# Patient Record
Sex: Male | Born: 1935 | Race: White | Hispanic: No
Health system: Southern US, Community
[De-identification: ages and names within clinical notes are randomized; demographics above are authoritative.]

---

## 2004-03-24 ENCOUNTER — Encounter: Payer: Self-pay | Admitting: Internal Medicine

## 2004-04-15 ENCOUNTER — Encounter: Payer: Self-pay | Admitting: Internal Medicine

## 2007-11-28 ENCOUNTER — Ambulatory Visit: Payer: Self-pay | Admitting: Internal Medicine

## 2008-01-14 ENCOUNTER — Emergency Department: Payer: Self-pay | Admitting: Emergency Medicine

## 2008-01-22 ENCOUNTER — Emergency Department: Payer: Self-pay | Admitting: Urology

## 2009-06-02 ENCOUNTER — Inpatient Hospital Stay: Payer: Self-pay | Admitting: Student

## 2009-06-02 ENCOUNTER — Ambulatory Visit: Payer: Self-pay | Admitting: Cardiovascular Disease

## 2010-02-06 ENCOUNTER — Ambulatory Visit: Payer: Self-pay | Admitting: Internal Medicine

## 2010-06-19 ENCOUNTER — Emergency Department: Payer: Self-pay | Admitting: Emergency Medicine

## 2010-06-19 ENCOUNTER — Ambulatory Visit: Payer: Self-pay | Admitting: Internal Medicine

## 2010-07-16 ENCOUNTER — Emergency Department: Payer: Self-pay | Admitting: Emergency Medicine

## 2011-03-11 ENCOUNTER — Ambulatory Visit: Payer: Self-pay

## 2011-05-20 ENCOUNTER — Ambulatory Visit: Payer: Self-pay | Admitting: Otolaryngology

## 2011-09-02 ENCOUNTER — Inpatient Hospital Stay: Payer: Self-pay | Admitting: Orthopedic Surgery

## 2011-09-02 LAB — CBC
HCT: 34.7 % — ABNORMAL LOW (ref 40.0–52.0)
HGB: 11.9 g/dL — ABNORMAL LOW (ref 13.0–18.0)
MCHC: 34.4 g/dL (ref 32.0–36.0)
MCV: 86 fL (ref 80–100)
Platelet: 159 10*3/uL (ref 150–440)
RDW: 14.7 % — ABNORMAL HIGH (ref 11.5–14.5)
WBC: 12.3 10*3/uL — ABNORMAL HIGH (ref 3.8–10.6)

## 2011-09-02 LAB — URINALYSIS, COMPLETE
Bilirubin,UR: NEGATIVE
Hyaline Cast: 1
Leukocyte Esterase: NEGATIVE
Nitrite: NEGATIVE
Protein: NEGATIVE
RBC,UR: 15 /HPF (ref 0–5)
Renal Epithelial: 4
Specific Gravity: 1.02 (ref 1.003–1.030)
Squamous Epithelial: <1
WBC UR: 2 /HPF (ref 0–5)

## 2011-09-02 LAB — COMPREHENSIVE METABOLIC PANEL
Alkaline Phosphatase: 73 U/L (ref 50–136)
Anion Gap: 10 (ref 7–16)
Bilirubin,Total: 1.3 mg/dL — ABNORMAL HIGH (ref 0.2–1.0)
Calcium, Total: 9.1 mg/dL (ref 8.5–10.1)
Chloride: 96 mmol/L — ABNORMAL LOW (ref 98–107)
Creatinine: 1.29 mg/dL (ref 0.60–1.30)
EGFR (Non-African Amer.): 53 — ABNORMAL LOW
Glucose: 118 mg/dL — ABNORMAL HIGH (ref 65–99)
Osmolality: 279 (ref 275–301)
Potassium: 3.4 mmol/L — ABNORMAL LOW (ref 3.5–5.1)
SGOT(AST): 32 U/L (ref 15–37)

## 2011-09-02 LAB — PROTIME-INR
INR: 1
Prothrombin Time: 14.2 secs (ref 11.5–14.7)

## 2011-09-03 LAB — BASIC METABOLIC PANEL
Anion Gap: 10 (ref 7–16)
Calcium, Total: 8.2 mg/dL — ABNORMAL LOW (ref 8.5–10.1)
EGFR (African American): >60
EGFR (Non-African Amer.): >60
Osmolality: 283 (ref 275–301)
Sodium: 140 mmol/L (ref 136–145)

## 2011-09-03 LAB — CBC WITH DIFFERENTIAL/PLATELET
Basophil %: 0.8 %
Eosinophil %: 5.1 %
HCT: 30.6 % — ABNORMAL LOW (ref 40.0–52.0)
HGB: 10.5 g/dL — ABNORMAL LOW (ref 13.0–18.0)
Lymphocyte #: 0.6 10*3/uL — ABNORMAL LOW (ref 1.0–3.6)
MCH: 29.5 pg (ref 26.0–34.0)
MCHC: 34.2 g/dL (ref 32.0–36.0)
Monocyte %: 7.2 %
Neutrophil #: 5.6 10*3/uL (ref 1.4–6.5)
Neutrophil %: 78.1 %
Platelet: 126 10*3/uL — ABNORMAL LOW (ref 150–440)
RBC: 3.55 10*6/uL — ABNORMAL LOW (ref 4.40–5.90)
WBC: 7.2 10*3/uL (ref 3.8–10.6)

## 2011-09-04 LAB — BASIC METABOLIC PANEL
Anion Gap: 9 (ref 7–16)
Calcium, Total: 8.2 mg/dL — ABNORMAL LOW (ref 8.5–10.1)
Co2: 28 mmol/L (ref 21–32)
EGFR (African American): >60
EGFR (Non-African Amer.): >60
Glucose: 77 mg/dL (ref 65–99)
Potassium: 3.1 mmol/L — ABNORMAL LOW (ref 3.5–5.1)

## 2011-09-06 ENCOUNTER — Encounter: Payer: Self-pay | Admitting: Internal Medicine

## 2011-09-06 LAB — POTASSIUM: Potassium: 3.5 mmol/L (ref 3.5–5.1)

## 2011-09-13 ENCOUNTER — Encounter: Payer: Self-pay | Admitting: Internal Medicine

## 2011-10-14 ENCOUNTER — Encounter: Payer: Self-pay | Admitting: Internal Medicine

## 2011-10-29 ENCOUNTER — Emergency Department: Payer: Self-pay | Admitting: Emergency Medicine

## 2011-10-29 LAB — BASIC METABOLIC PANEL
Anion Gap: 8 (ref 7–16)
BUN: 25 mg/dL — ABNORMAL HIGH (ref 7–18)
Calcium, Total: 8.6 mg/dL (ref 8.5–10.1)
Chloride: 105 mmol/L (ref 98–107)
Co2: 27 mmol/L (ref 21–32)
Creatinine: 0.83 mg/dL (ref 0.60–1.30)
EGFR (African American): >60
Osmolality: 284 (ref 275–301)
Potassium: 3.7 mmol/L (ref 3.5–5.1)
Sodium: 140 mmol/L (ref 136–145)

## 2011-10-29 LAB — CBC
HCT: 31.4 % — ABNORMAL LOW (ref 40.0–52.0)
MCH: 30.2 pg (ref 26.0–34.0)
MCHC: 34 g/dL (ref 32.0–36.0)
RDW: 14.5 % (ref 11.5–14.5)

## 2011-11-10 ENCOUNTER — Ambulatory Visit: Payer: Self-pay | Admitting: Hematology and Oncology

## 2011-11-14 ENCOUNTER — Encounter: Payer: Self-pay | Admitting: Internal Medicine

## 2011-11-26 ENCOUNTER — Emergency Department: Payer: Self-pay | Admitting: Emergency Medicine

## 2011-11-26 LAB — COMPREHENSIVE METABOLIC PANEL
Albumin: 2.6 g/dL — ABNORMAL LOW (ref 3.4–5.0)
Alkaline Phosphatase: 61 U/L (ref 50–136)
BUN: 18 mg/dL (ref 7–18)
Bilirubin,Total: 0.4 mg/dL (ref 0.2–1.0)
Co2: 32 mmol/L (ref 21–32)
Creatinine: 0.89 mg/dL (ref 0.60–1.30)
EGFR (Non-African Amer.): >60
Glucose: 140 mg/dL — ABNORMAL HIGH (ref 65–99)
Osmolality: 289 (ref 275–301)
SGPT (ALT): <6 U/L — ABNORMAL LOW (ref 12–78)
Sodium: 143 mmol/L (ref 136–145)
Total Protein: 6.7 g/dL (ref 6.4–8.2)

## 2011-11-26 LAB — CBC WITH DIFFERENTIAL/PLATELET
Basophil #: 0 10*3/uL (ref 0.0–0.1)
Basophil %: 0.6 %
Eosinophil %: 3.3 %
HGB: 10.2 g/dL — ABNORMAL LOW (ref 13.0–18.0)
Lymphocyte #: 0.7 10*3/uL — ABNORMAL LOW (ref 1.0–3.6)
Lymphocyte %: 9.6 %
MCV: 84 fL (ref 80–100)
Monocyte %: 7.5 %
Neutrophil %: 79 %
Platelet: 212 10*3/uL (ref 150–440)
RBC: 3.56 10*6/uL — ABNORMAL LOW (ref 4.40–5.90)
RDW: 14 % (ref 11.5–14.5)
WBC: 7.1 10*3/uL (ref 3.8–10.6)

## 2011-11-26 LAB — TROPONIN I
Troponin-I: 0.04 ng/mL
Troponin-I: 0.04 ng/mL

## 2012-04-03 IMAGING — CT CT CHEST W/ CM
1 series · 16 of 33 positions shown, 20 images · IV contrast (agent unspecified)
Comparison: none

REASON FOR EXAM: Abn Chest Xray RT Lower Lobe Infilitrate Diabetic
Metformin
COMMENTS:

PROCEDURE:     KCT - KCT CHEST WITH CONTRAST  - February 06, 2010 [DATE]
RESULT:
TECHNIQUE: Helical 5 mm sections were obtained from the thoracic inlet
through the lung bases.

[Series 2: chest w/ 5.0 i41f · axial · 0.74mm/px · z∈[-1354,-1069]mm · 16 of 63 slices shown, 20 images]
[im 3/63  mediastinal]
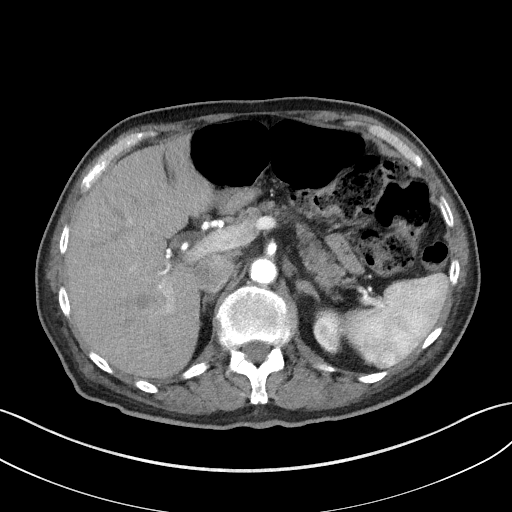
[im 3/63  lung]
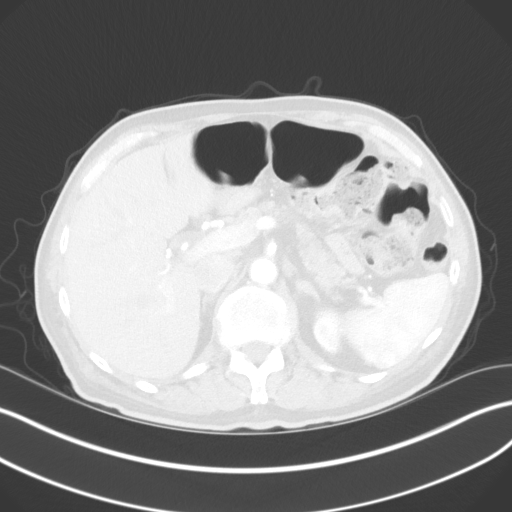
[im 7/63  lung]
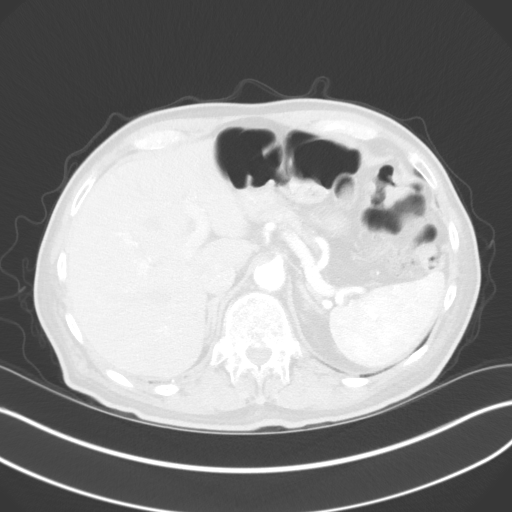
[im 12/63  lung]
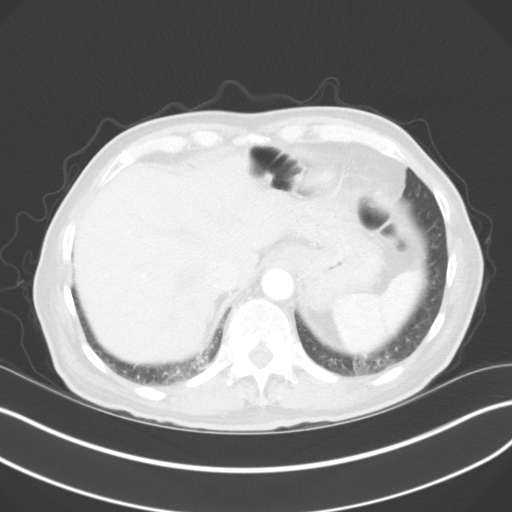
[im 14/63  lung]
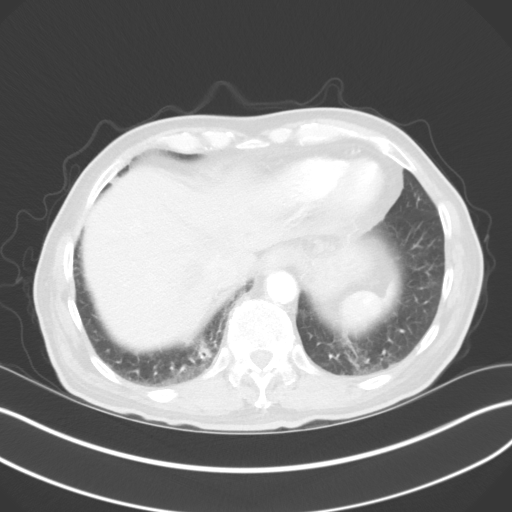
[im 19/63  mediastinal]
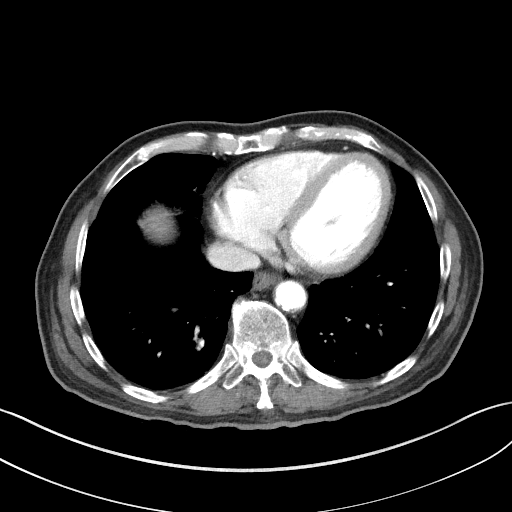
[im 19/63  lung]
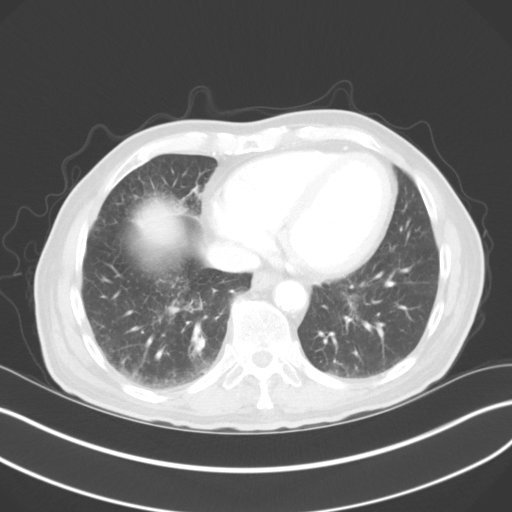
[im 23/63  lung]
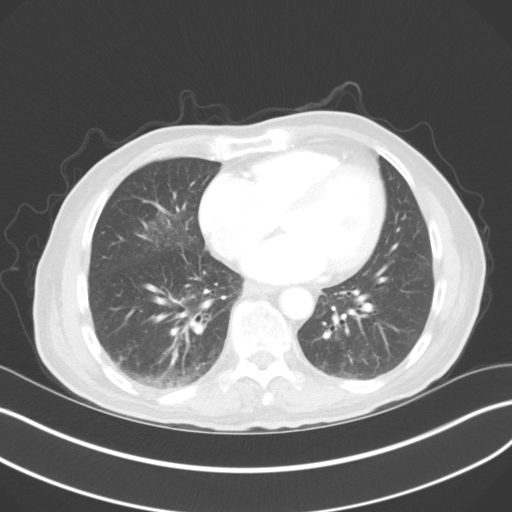
[im 26/63  lung]
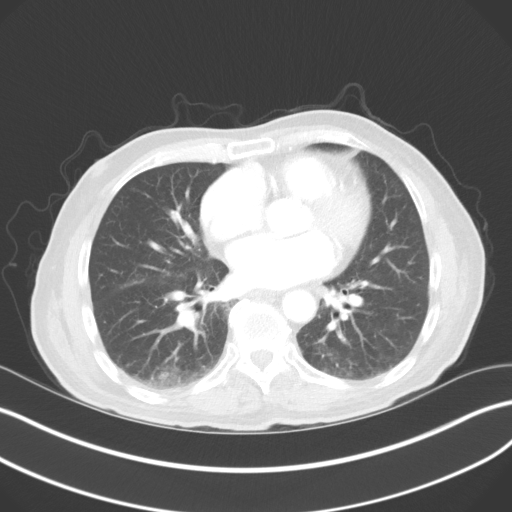
[im 30/63  lung]
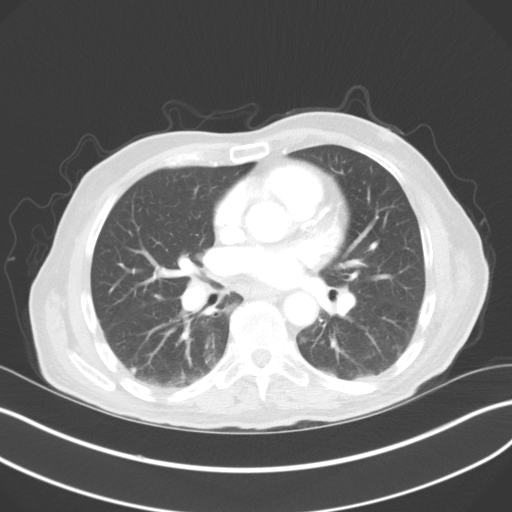
[im 34/63  mediastinal]
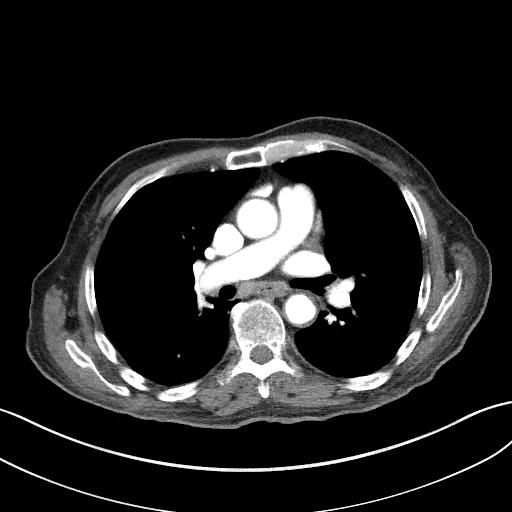
[im 34/63  lung]
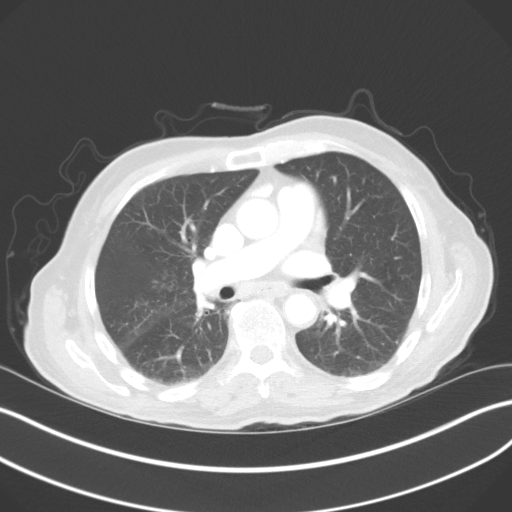
[im 37/63  lung]
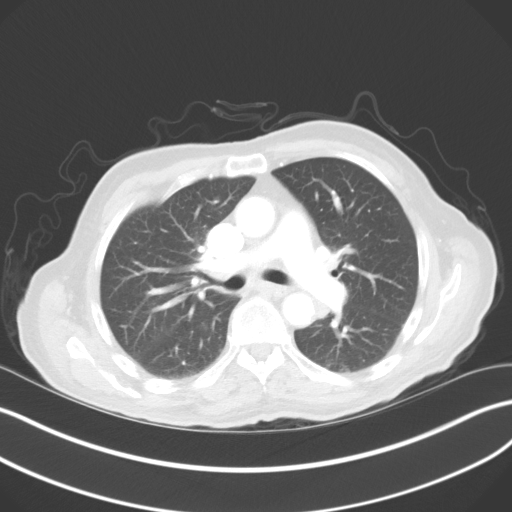
[im 40/63  lung]
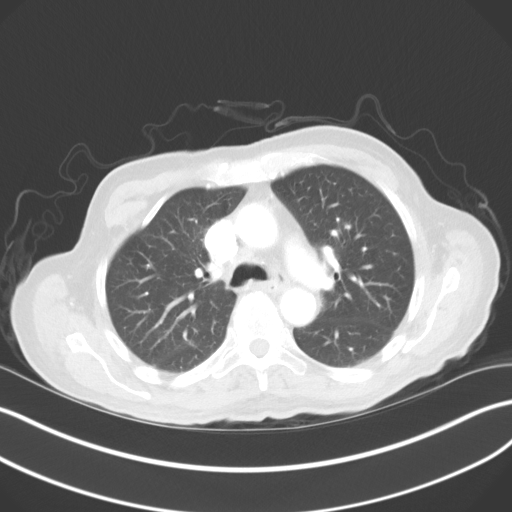
[im 44/63  lung]
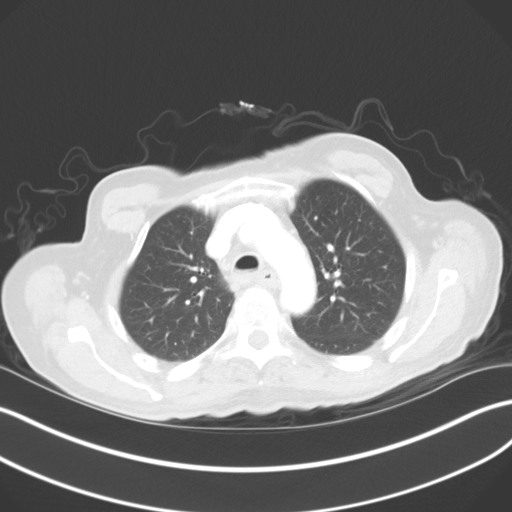
[im 49/63  mediastinal]
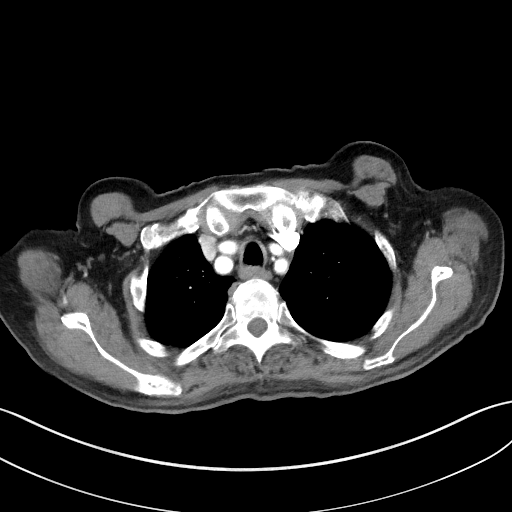
[im 49/63  lung]
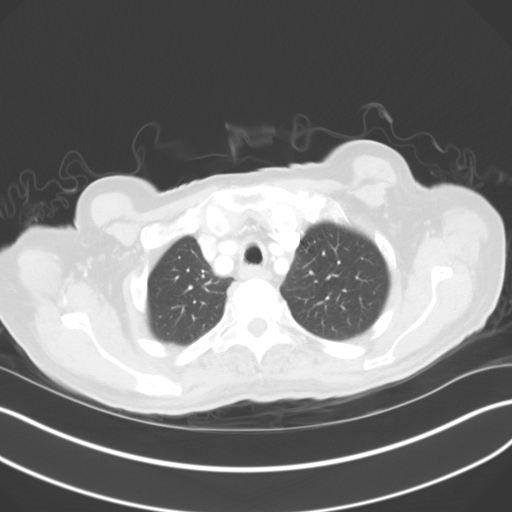
[im 51/63  lung]
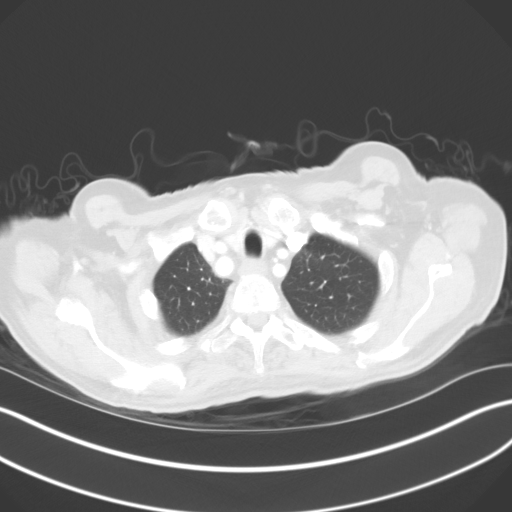
[im 56/63  lung]
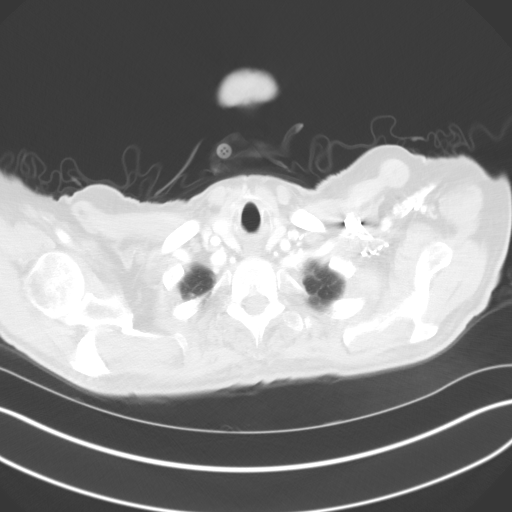
[im 60/63  lung]
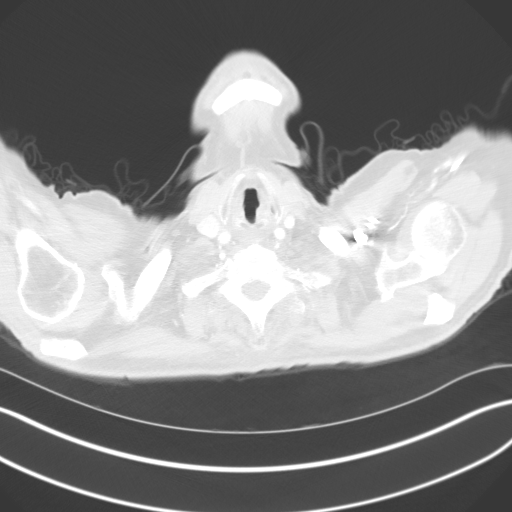

[16 of 33 positions shown; findings below may reference images not displayed]

FINDINGS: Evaluation of the mediastinum and hilar regions and structures
demonstrates no evidence of mediastinal nor hilar adenopathy nor masses. The
lung parenchyma demonstrates hypoventilation within the lung bases. A focal
ill-defined area of increased density projects in the medial basal segment
right lower lobe. Differential considerations are atelectasis versus
infiltrate. The visualized upper abdominal viscera demonstrate no gross
abnormalities.
IMPRESSION: 1. Mild area of atelectasis versus infiltrate right lung base.
2. Minimal hypoventilation
3. No further focal or acute abnormalities. The finding within the right
lung base can be monitored. More ominous etiologies cannot be completely
excluded though are of much lower differential consideration.

## 2012-04-14 ENCOUNTER — Inpatient Hospital Stay: Payer: Self-pay | Admitting: Surgery

## 2012-04-14 LAB — TROPONIN I
Troponin-I: 0.2 ng/mL — ABNORMAL HIGH
Troponin-I: 0.19 ng/mL — ABNORMAL HIGH
Troponin-I: 0.21 ng/mL — ABNORMAL HIGH

## 2012-04-14 LAB — COMPREHENSIVE METABOLIC PANEL
Albumin: 3.5 g/dL (ref 3.4–5.0)
Bilirubin,Total: 0.7 mg/dL (ref 0.2–1.0)
Calcium, Total: 9.3 mg/dL (ref 8.5–10.1)
Chloride: 102 mmol/L (ref 98–107)
Creatinine: 1.46 mg/dL — ABNORMAL HIGH (ref 0.60–1.30)
EGFR (African American): 52 — ABNORMAL LOW
EGFR (Non-African Amer.): 45 — ABNORMAL LOW
Glucose: 130 mg/dL — ABNORMAL HIGH (ref 65–99)
Osmolality: 291 (ref 275–301)
SGOT(AST): 21 U/L (ref 15–37)
SGPT (ALT): <6 U/L — ABNORMAL LOW (ref 12–78)
Sodium: 142 mmol/L (ref 136–145)
Total Protein: 7.9 g/dL (ref 6.4–8.2)

## 2012-04-14 LAB — URINALYSIS, COMPLETE
Bilirubin,UR: NEGATIVE
Glucose,UR: NEGATIVE mg/dL (ref 0–75)
Leukocyte Esterase: NEGATIVE
Protein: 30
RBC,UR: 265 /HPF (ref 0–5)
Specific Gravity: 1.025 (ref 1.003–1.030)
Squamous Epithelial: 1

## 2012-04-14 LAB — LIPASE, BLOOD: Lipase: 45 U/L — ABNORMAL LOW (ref 73–393)

## 2012-04-14 LAB — CBC
HGB: 10.3 g/dL — ABNORMAL LOW (ref 13.0–18.0)
MCH: 25.4 pg — ABNORMAL LOW (ref 26.0–34.0)
MCV: 81 fL (ref 80–100)
Platelet: 205 10*3/uL (ref 150–440)
RDW: 18.3 % — ABNORMAL HIGH (ref 11.5–14.5)

## 2012-04-14 LAB — APTT: Activated PTT: 35.9 secs (ref 23.6–35.9)

## 2012-04-14 LAB — CK TOTAL AND CKMB (NOT AT ARMC)
CK-MB: 1.1 ng/mL (ref 0.5–3.6)
CK-MB: 1.5 ng/mL (ref 0.5–3.6)

## 2012-04-14 LAB — MAGNESIUM: Magnesium: 2.1 mg/dL

## 2012-04-14 LAB — POTASSIUM: Potassium: 2.8 mmol/L — ABNORMAL LOW (ref 3.5–5.1)

## 2012-04-15 DIAGNOSIS — I369 Nonrheumatic tricuspid valve disorder, unspecified: Secondary | ICD-10-CM

## 2012-04-15 LAB — CBC WITH DIFFERENTIAL/PLATELET
Basophil #: 0.1 10*3/uL (ref 0.0–0.1)
Eosinophil %: 0.7 %
HGB: 10.4 g/dL — ABNORMAL LOW (ref 13.0–18.0)
Lymphocyte #: 0.8 10*3/uL — ABNORMAL LOW (ref 1.0–3.6)
Lymphocyte %: 9.3 %
MCH: 27 pg (ref 26.0–34.0)
MCV: 81 fL (ref 80–100)
Monocyte #: 0.6 x10 3/mm (ref 0.2–1.0)
Monocyte %: 6.7 %
Neutrophil #: 7.3 10*3/uL — ABNORMAL HIGH (ref 1.4–6.5)
RBC: 3.84 10*6/uL — ABNORMAL LOW (ref 4.40–5.90)
WBC: 8.8 10*3/uL (ref 3.8–10.6)

## 2012-04-15 LAB — COMPREHENSIVE METABOLIC PANEL
Bilirubin,Total: 0.6 mg/dL (ref 0.2–1.0)
Calcium, Total: 8.3 mg/dL — ABNORMAL LOW (ref 8.5–10.1)
Chloride: 111 mmol/L — ABNORMAL HIGH (ref 98–107)
Co2: 30 mmol/L (ref 21–32)
Creatinine: 1.11 mg/dL (ref 0.60–1.30)
EGFR (African American): >60
SGPT (ALT): 7 U/L — ABNORMAL LOW (ref 12–78)

## 2012-04-15 LAB — POTASSIUM: Potassium: 3.3 mmol/L — ABNORMAL LOW (ref 3.5–5.1)

## 2012-04-16 LAB — BASIC METABOLIC PANEL
Anion Gap: 8 (ref 7–16)
BUN: 18 mg/dL (ref 7–18)
Calcium, Total: 7.9 mg/dL — ABNORMAL LOW (ref 8.5–10.1)
Creatinine: 0.73 mg/dL (ref 0.60–1.30)
EGFR (Non-African Amer.): >60
Glucose: 76 mg/dL (ref 65–99)
Osmolality: 291 (ref 275–301)
Potassium: 3.9 mmol/L (ref 3.5–5.1)
Sodium: 146 mmol/L — ABNORMAL HIGH (ref 136–145)

## 2012-04-16 LAB — CBC WITH DIFFERENTIAL/PLATELET
Basophil #: 0.1 10*3/uL (ref 0.0–0.1)
Basophil %: 0.7 %
Eosinophil %: 1.1 %
HGB: 10.7 g/dL — ABNORMAL LOW (ref 13.0–18.0)
Lymphocyte #: 0.6 10*3/uL — ABNORMAL LOW (ref 1.0–3.6)
Lymphocyte %: 6.4 %
MCHC: 32.5 g/dL (ref 32.0–36.0)
Monocyte #: 0.7 x10 3/mm (ref 0.2–1.0)
Neutrophil #: 8.4 10*3/uL — ABNORMAL HIGH (ref 1.4–6.5)
Platelet: 189 10*3/uL (ref 150–440)
RDW: 18.8 % — ABNORMAL HIGH (ref 11.5–14.5)

## 2012-04-17 LAB — BASIC METABOLIC PANEL
BUN: 15 mg/dL (ref 7–18)
Calcium, Total: 8.1 mg/dL — ABNORMAL LOW (ref 8.5–10.1)
Co2: 23 mmol/L (ref 21–32)
Creatinine: 0.72 mg/dL (ref 0.60–1.30)
EGFR (African American): >60
EGFR (Non-African Amer.): >60
Glucose: 61 mg/dL — ABNORMAL LOW (ref 65–99)
Osmolality: 286 (ref 275–301)
Potassium: 3.3 mmol/L — ABNORMAL LOW (ref 3.5–5.1)
Sodium: 144 mmol/L (ref 136–145)

## 2012-04-18 LAB — BASIC METABOLIC PANEL
Anion Gap: 15 (ref 7–16)
BUN: 22 mg/dL — ABNORMAL HIGH (ref 7–18)
BUN: 17 mg/dL (ref 7–18)
Calcium, Total: 8 mg/dL — ABNORMAL LOW (ref 8.5–10.1)
Chloride: 106 mmol/L (ref 98–107)
Chloride: 110 mmol/L — ABNORMAL HIGH (ref 98–107)
Co2: 20 mmol/L — ABNORMAL LOW (ref 21–32)
Co2: 24 mmol/L (ref 21–32)
Creatinine: 0.82 mg/dL (ref 0.60–1.30)
Creatinine: 0.82 mg/dL (ref 0.60–1.30)
EGFR (African American): >60
EGFR (Non-African Amer.): >60
Glucose: 171 mg/dL — ABNORMAL HIGH (ref 65–99)
Osmolality: 287 (ref 275–301)
Osmolality: 287 (ref 275–301)
Potassium: 3.6 mmol/L (ref 3.5–5.1)
Potassium: 3.7 mmol/L (ref 3.5–5.1)
Sodium: 141 mmol/L (ref 136–145)

## 2012-04-18 LAB — WBC: WBC: 14 10*3/uL — ABNORMAL HIGH (ref 3.8–10.6)

## 2012-04-18 LAB — CBC WITH DIFFERENTIAL/PLATELET
Basophil #: 0 10*3/uL (ref 0.0–0.1)
Basophil %: 0.2 %
Lymphocyte #: 0.5 10*3/uL — ABNORMAL LOW (ref 1.0–3.6)
Lymphocyte %: 2.5 %
MCH: 26.1 pg (ref 26.0–34.0)
MCHC: 31.7 g/dL — ABNORMAL LOW (ref 32.0–36.0)
MCV: 82 fL (ref 80–100)
Monocyte %: 4.2 %
Neutrophil #: 19.1 10*3/uL — ABNORMAL HIGH (ref 1.4–6.5)
Neutrophil %: 93 %
Platelet: 254 10*3/uL (ref 150–440)
RBC: 4.04 10*6/uL — ABNORMAL LOW (ref 4.40–5.90)
WBC: 20.5 10*3/uL — ABNORMAL HIGH (ref 3.8–10.6)

## 2012-04-19 LAB — WBC: WBC: 9.6 10*3/uL (ref 3.8–10.6)

## 2012-04-19 LAB — BASIC METABOLIC PANEL
Anion Gap: 6 — ABNORMAL LOW (ref 7–16)
BUN: 14 mg/dL (ref 7–18)
Calcium, Total: 7.9 mg/dL — ABNORMAL LOW (ref 8.5–10.1)
Chloride: 110 mmol/L — ABNORMAL HIGH (ref 98–107)
Co2: 25 mmol/L (ref 21–32)
Creatinine: 0.69 mg/dL (ref 0.60–1.30)
EGFR (African American): >60
EGFR (Non-African Amer.): >60
Glucose: 169 mg/dL — ABNORMAL HIGH (ref 65–99)
Potassium: 3.4 mmol/L — ABNORMAL LOW (ref 3.5–5.1)
Sodium: 141 mmol/L (ref 136–145)

## 2012-04-20 LAB — BASIC METABOLIC PANEL
Co2: 28 mmol/L (ref 21–32)
Glucose: 96 mg/dL (ref 65–99)
Osmolality: 282 (ref 275–301)
Potassium: 3.1 mmol/L — ABNORMAL LOW (ref 3.5–5.1)
Sodium: 142 mmol/L (ref 136–145)

## 2012-04-20 LAB — CBC WITH DIFFERENTIAL/PLATELET
Basophil #: 0.1 10*3/uL (ref 0.0–0.1)
Basophil %: 0.6 %
Eosinophil #: 0.3 10*3/uL (ref 0.0–0.7)
Eosinophil %: 3.6 %
HCT: 28.3 % — ABNORMAL LOW (ref 40.0–52.0)
HGB: 9.5 g/dL — ABNORMAL LOW (ref 13.0–18.0)
MCV: 82 fL (ref 80–100)
Monocyte #: 0.7 x10 3/mm (ref 0.2–1.0)
Monocyte %: 8.1 %
Platelet: 188 10*3/uL (ref 150–440)
RDW: 18.5 % — ABNORMAL HIGH (ref 11.5–14.5)
WBC: 8.8 10*3/uL (ref 3.8–10.6)

## 2012-04-20 LAB — MAGNESIUM: Magnesium: 1.4 mg/dL — ABNORMAL LOW

## 2012-04-21 LAB — CULTURE, BLOOD (SINGLE)

## 2012-04-21 LAB — MAGNESIUM: Magnesium: 1.9 mg/dL

## 2012-04-23 LAB — CBC WITH DIFFERENTIAL/PLATELET
Basophil #: 0.1 10*3/uL (ref 0.0–0.1)
Basophil %: 1.1 %
Eosinophil #: 0.1 10*3/uL (ref 0.0–0.7)
Eosinophil %: 2 %
HGB: 9.3 g/dL — ABNORMAL LOW (ref 13.0–18.0)
MCH: 26.1 pg (ref 26.0–34.0)
MCV: 83 fL (ref 80–100)
Monocyte #: 0.5 x10 3/mm (ref 0.2–1.0)
Monocyte %: 9.3 %
Neutrophil #: 4.6 10*3/uL (ref 1.4–6.5)
Neutrophil %: 82.8 %
RBC: 3.55 10*6/uL — ABNORMAL LOW (ref 4.40–5.90)
RDW: 18.1 % — ABNORMAL HIGH (ref 11.5–14.5)
WBC: 5.6 10*3/uL (ref 3.8–10.6)

## 2012-04-23 LAB — URIC ACID: Uric Acid: 2.8 mg/dL — ABNORMAL LOW (ref 3.5–7.2)

## 2012-04-23 LAB — LACTATE DEHYDROGENASE: LDH: 179 U/L (ref 85–241)

## 2012-04-23 LAB — BASIC METABOLIC PANEL
BUN: 10 mg/dL (ref 7–18)
Creatinine: 1.29 mg/dL (ref 0.60–1.30)
EGFR (Non-African Amer.): 52 — ABNORMAL LOW
Glucose: 104 mg/dL — ABNORMAL HIGH (ref 65–99)
Osmolality: 279 (ref 275–301)
Potassium: 3.8 mmol/L (ref 3.5–5.1)

## 2012-05-13 DEATH — deceased

## 2014-07-05 NOTE — Consult Note (Signed)
Brief Consult Note: Diagnosis: sigmoid volvulus.   Patient was seen by consultant.   Recommend to proceed with surgery or procedure.   Orders entered.   Discussed with Attending MD.   Comments: Patietn seen and examined earlier this am.  Patietn presenting with abdominal pain starting early this am.  Patient with abdominal distension, CT c/w sigmoid volvulus.   Patient with hypokalemia, getting replacement.  Will proceed with colonoscopy for decompression today.  I have discussed the risks benefits and complications of colonoscopy tiop include not limited to bleeding infectio perforation and sedation and family wish to proceed.  The understand this is a high risk proceedure. discussed with Dr Marc Morganskafor.  Electronic Signatures for Addendum Section:  Barnetta ChapelSkulskie, Martin (MD) (Signed Addendum 31-Jan-14 16:21)  Please see full GI consult [id number].   Electronic Signatures: Barnetta ChapelSkulskie, Martin (MD)  (Signed 31-Jan-14 11:57)  Authored: Brief Consult Note   Last Updated: 31-Jan-14 16:21 by Barnetta ChapelSkulskie, Martin (MD)

## 2014-07-05 NOTE — Discharge Summary (Signed)
PATIENT NAME:  Johnathan NestleCHRISTOPHER, Johnathan Nolan MR#:  [id number] DATE OF BIRTH:  May 19, 1930  DATE OF ADMISSION:  04/14/2012 DATE OF DISCHARGE:    FINAL DIAGNOSES:  1.  Sigmoid volvulus.  2.  Advanced Parkinson's disease.  3.  History of expressive aphasia with history of cerebrovascular accident.    4.  Hypertension.  5.  Diet-controlled type 2 diabetes.  6.  Hypertension.  7.  History of benign prostatic hypertrophy.  8.  History of bradycardia.   PRINCIPLE PROCEDURES:  1.  Sigmoid colectomy and colostomy.  2.  Decompressive colonoscopy featuring Dr. Marva PandaSkulskie.  3.  Gastroenterology consultation.  4.  Medicine consultation.   HOSPITAL COURSE SUMMARY: The patient was admitted to the medical service with abdominal pain and distention. An urgent gastroenterology consultation was obtained at which point colonoscopy was performed the evening of the 31st of January. Sigmoid volvulus was noted. Surgical service then became involved. Due to the patient's marked Parkinson's disease and expressive aphasia, he was not felt at the time to be a good surgical candidate. A nasogastric tube was left in place. Over the course of the next 2 days, the patient had no real improvement of his abdominal distention on plain films. The family was then approached again by the surgical service regarding definitive therapy to include colostomy given his nearly bedridden state. After speaking with the son at length, I took the patient to the Operating Room at which point sigmoid colectomy was performed with construction of an end colostomy. This was performed on the 3rd of February. The patient's postoperative stay was fairly uncomplicated. He did undergo a speech therapy evaluation and appropriate orders for dietary supplements and changing his carbidopa/levodopa to prior to meals was implemented. The patient seemed to do well with this. His ostomy began working, his wound was healing nicely.   DICTATION ENDS HERE.        ____________________________ Redge GainerMark A. Egbert GaribaldiBird, MD mab:cs D: 04/23/2012 18:22:00 ET T: 04/23/2012 18:59:46 ET JOB#: [id number]  cc: Loraine LericheMark A. Egbert GaribaldiBird, MD, <Dictator> Neomia Dearavid N. Harrington Challengerhies, MD  Raynald KempMARK A Ruhee Enck MD ELECTRONICALLY SIGNED 04/24/2012 7:49

## 2014-07-05 NOTE — Op Note (Signed)
PATIENT NAME:  Johnathan Nolan, Johnathan Nolan MR#:  [id number] DATE OF BIRTH:  1931/02/15  DATE OF PROCEDURE:  04/17/2012  PREOPERATIVE DIAGNOSIS: Sigmoid volvulus.   POSTOPERATIVE DIAGNOSIS: Sigmoid volvulus.   PROCEDURE PERFORMED: Sigmoid colectomy with end sigmoid colostomy.   SURGEON: Natale LayMark Maricarmen Braziel, M.D. FACS  ASSISTANT: Scrub Nurse.  ANESTHESIA: General endotracheal, [facility].   INDICATIONS: This is a 81 year old white male with sigmoid volvulus reduced on the 31st of January endoscopically. He has had failure to progress and symptoms of abdominal distention, inability to eat. Repeat x-rays today are concerning for recurrent volvulus.   FINDINGS: There appeared to be a recurrent volvulus within the sigmoid colon. The bowel was viable. The sigmoid colon was massively dilated.   SPECIMENS: Sigmoid colon to pathology.   ESTIMATED BLOOD LOSS: 125 mL.   DRAINS: None. LAP and needle count correct x 2.   DESCRIPTION OF PROCEDURE:   After informed consent was obtained from the patient's son and healthcare power of attorney, he was brought to the operating room and positioned supine. General oral endotracheal anesthesia was induced. The patient's abdomen was widely prepped and draped with ChloraPrep solution, timeout was observed. A short vertical midline incision was fashioned around the umbilicus to above symphysis pubis with scapel and then carried down with electrocautery through the musculofascial layers. Immediately a large dilated loop of sigmoid colon presented itself. I was able to reduce the volvulus delivering the sigmoid colon into the wound. Because of its massively dilated nature, I decompressed this by extruding the air through a pursestring suture of 3-0 silk, then tying the suture. With proper orientation of the sigmoid colon defining proximal and distal loops, the sigmoid colon was then divided both proximally and distally with fires of the GIA 75 stapler. Intervening  mesentery was divided between clamps and ties of #0 Vicryl suture, larger vessels being doubly clamped and doubly tied. Distally the staple line was imbricated with 3-0 silk sutures. Proximally the sigmoid colon was then further mobilized along the white line of Toldt. A skin incision was fashioned in the left lower quadrant through the rectus muscle in cruciate fashion through the anterior and posterior fascia with muscle-splitting technique. The sigmoid colon was then brought up through this incision as an ostomy. Seromuscular 3-0 silk sutures of colon to peritoneum where placed.     The abdomen was irrigated and aspirated dry, LAP and needle count was correct x 2, and the abdominal midline fascia was closed with running #1 Vicryl from the extremes of the wounds with interrupted #1 Vicryls as internal retention sutures. Subcutaneous tissues were then irrigated. Skin staples were used to reapproximate the skin edges.    Anteriorly the sigmoid colon loop was attached to the anterior fascia at four quadrants with 3-0 silk suture. A standard Brooke colostomy was fashioned with 3-0 chromic sutures. An ostomy appliance was placed. Dressing was applied. The patient was subsequently extubated and taken to the recovery room in stable and satisfactory condition by anesthesia services.   ____________________________ Redge GainerMark A. Egbert GaribaldiBird, MD FACS mab:sb D: 04/18/2012 10:24:15 ET T: 04/18/2012 12:47:31 ET JOB#: [id number]  cc: Loraine LericheMark A. Egbert GaribaldiBird, MD, <Dictator> Neomia Dearavid N. Harrington Challengerhies, MD [facility], [facility]. Barnetta ChapelMartin Skulskie, MD GI medicine Letetia Romanello A Elenore Wanninger MD ELECTRONICALLY SIGNED 05/02/2012 21:07

## 2014-07-05 NOTE — H&P (Signed)
PATIENT NAME:  Johnathan Nolan, Johnathan Nolan MR#:  [id number] DATE OF BIRTH:  [birth year]  DATE OF ADMISSION:  04/14/2012  CODE STATUS: DO NOT RESUSCITATE.   SURROGATE DECISION MAKER: His son, Audy Dauphine, who is his health care power of attorney. Please note that intubation was discussed with the son and he notes that if needed, the patient can be intubated.   CHIEF COMPLAINT: Abdominal distention.    HISTORY OF PRESENT ILLNESS: This is a 81 year old male with a history of diabetes mellitus, hypertension, CVA complicated by expressive aphasia who presents with abdominal distention of estimated 2 days duration. History is obtained from his spouse as well as his son, the son is at the bedside. Apparently the patient has had decreased p.o. intake for the last 2 to 3 days. Per his wife, he "quit eating and could not swallow".  She has noticed that he has had abdominal distention over the same period of time for the last 2 to 3 days, and that patient would constantly rub his stomach. On his last bowel movement was one day prior to admission and it was runny but no blood was noted. She denies any fevers. Due to the worsening distention, the patient presented to the Emergency Department. There has been no vomiting or frank diarrhea reported.   In the Emergency Department, a CT of the abdomen was done which showed a sigmoid volvulus with concern for possible coarse opacities in the lungs concerning for aspiration. We have been called for admission. I reviewed prior medical records as well to obtain history.   PAST MEDICAL HISTORY:  1. Type 2 diabetes mellitus, diet controlled.  2. Hypertension.  3. Expressive aphasia status post CVA.  4. Hypertension.  5. Hypokalemia.  6. A history of Parkinson's disease.  7. Benign prostatic hypertrophy.  8. Depression.  9. A history of bradycardia.  PAST SURGICAL HISTORY: Prostate surgery. Right hip surgery, 08/2011.  ALLERGIES: CEFTIN, ZITHROMAX, ALBUTEROL.    HOME MEDICATIONS:  1. Amlodipine 5 mg daily.  2. Carbidopa, levodopa 25/100 mg 2 tablets daily.  3. Coreg 6.25 mg twice daily.  4. Cleocin 300 mg daily for 7 days. Please note duration of this medication is unknown.  5. Ibuprofen 600 mg 3 times a day.  6. Levaquin 750 mg 1 tablet daily. (The duration of this medication is unknown). 7. Losartan 50 mg daily.  8. Namenda 10 mg twice daily.  9. Nasacort inhaled 2 sprays intranasal as needed.  10. Seroquel 25 mg 3 times a day.  11. Tussionex 5 mL twice a day as needed for 7 days (the duration of this prescription is unknown).   FAMILY HISTORY: Unable to obtain due to the patient's aphasia.   SOCIAL HISTORY: No history of tobacco, alcohol or illicit drug use.  REVIEW OF SYSTEMS  Unable to obtain due to expressive aphasia. Per history only notable findings are abdominal distention and decreased oral intake. The wife adamantly denies any emesis. There is also a history of chronic aspiration.   PHYSICAL EXAMINATION:  VITAL SIGNS: On presentation, temperature 98.3, heart rate 45, pulse 40 to 45, respirations 18, blood pressure 137/62, pulse ox 100% on room air.  GENERAL: An elderly, chronically ill-appearing male in no apparent distress. He is able to track but does not verbalize. He does not nod understanding.  PSYCHIATRIC: Unable to assess the patient's insight or judgment. He is awake and alert, however, unable to assess orientation. He is not nodding in response to any questioning.  EYES: Anicteric. Pupils  equally round and reactive to light and accommodation.  ENT: Normal external ears and nares. Posterior pharynx clear without erythema or exudate.  CARDIOVASCULAR: Bradycardic. No murmurs appreciated. No pretibial edema.  PULMONARY: Clear to auscultation bilaterally. No wheezes, rales, or rhonchi. Normal effort.  ABDOMEN: There is mild diffuse tenderness on palpation. The bowel sounds are significantly hypoactive and the abdomen is firm.   RECTAL: Heme-positive stool from his diaper.  SKIN: Normal color. No rashes appreciated. It is warm and dry.  MUSCULOSKELETAL: Unable to assess strength. No clubbing or cyanosis appreciated.  LYMPHATICS: No cervical or inguinal lymphadenopathy appreciated.   LABORATORY DATA: Glucose 130, BUN of 30, creatinine 1.46. This is increased from 0.89 in 11/2011, sodium 142, potassium 3, chloride 102, bicarb 30, anion gap of 10. GFR 45, osmolality of 291, calcium 9.3. Lipase is low at 45. Troponin is elevated to 0.2. Total protein is 7.9, albumin 3.5, bilirubin 0.7, alk phos 75, AST 21, ALT less than 6.  CK 43, CK-MB 1.4.  White count 14.1, hemoglobin 10.3, hematocrit 33, platelets 205 with an MCV of 81. Please note that his hemoglobin seems to run in the tens.   Urinalysis shows negative nitrites, negative leukocytes.   CT of the abdomen shows sigmoid volvulus. The proximal sigmoid colon is markedly distended and airflow extending into the upper abdomen. No evidence of free intraperitoneal air. There is mass effect on the stomach. Fluid is identified in the distal esophagus indicating gastroesophageal reflux. Coarse opacities are noted in the lung bases likely secondary to aspiration. Mild bronchiectasis is present. The gallbladder is contracted. Nonobstructive right nephrolithiasis is present.   ASSESSMENT AND PLAN: This is a 81 year old male presenting with abdominal distress distention, found to have significant sigmoid volvulus.  1. Sigmoid volvulus. This case has been discussed with Dr. Gustavo Lah of Gastroenterology. The patient is placed n.p.o. We will hold off on any nasogastric decompression. The patient will be admitted to the Critical Care Unit for a possible plan of colorectal decompression. This plan was discussed with the son, who is the healthcare power of attorney, and he is agreeable.  2. Aspiration pneumonitis. Given leukocytosis we will empirically cover with Zosyn. This can be  discontinued once his leukocytosis improves and if he persistently remained afebrile.  3. Acute renal failure. Normal saline at 100 mL an hour. This is most likely prerenal. Monitor BMP daily.  4. Hypokalemia. This has been repleted in the Emergency Department. 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 We will follow up a basic metabolic panel in the morning.  5. Possible GI bleed. The patient's hemoglobin is stable. There is no evidence of overt bleeding and we will hold off on IV PPIs for now and await the Gastrointestinal evaluation of the colon.  6. Diabetes mellitus, diet-controlled. At this time we can cover him with sliding scale insulin while he is n.p.o.  7. Hypertension. We will hold his losartan given his acute renal failure and his p.o. meds due to his risk for aspiration. IV metoprolol p.r.n. q.6 hours at this point.  8. Parkinson's. We will hold his oral meds for now.  9. Elevated cardiac enzymes. We will cycle cardiac enzymes.  10. Deep vein thrombosis prophylaxis. PCBs given. Concern of possible GI bleed. If ruled out that, he can be advanced to heparin subcutaneously.   CODE STATUS: The patient is a DO NOT RESUSCITATE. This was discussed extensively with his son, Jemiah Ellenburg, who is his power of attorney. I did discuss the possibility of intubation particularly  if required for the procedure and he is agreeable to intubation if needed.   DISPOSITION: The patient is being admitted inpatient to the Critical Care Unit for management of a sigmoid volvulus with acute renal failure and possible GI bleed. I anticipate he will require two hospital midnight stays for management.   CRITICAL CARE TIME SPENT: 60 minutes.   ____________________________ Samson Frederic, DO aeo:jm D: 04/14/2012 08:14:19 ET T: 04/14/2012 10:11:08 ET JOB#: [id number]  cc: Samson Frederic, DO, <Dictator> Lollie Sails, MD Nadean Corwin, MD Yan Pankratz E Tayra Dawe DO ELECTRONICALLY SIGNED 05/09/2012 3:37

## 2014-07-05 NOTE — Consult Note (Signed)
PATIENT NAME:  Johnathan Nolan, Johnathan Nolan MR#:  [id number] DATE OF BIRTH:  26-Nov-1930  DATE OF CONSULTATION:  04/15/2012  CONSULTING PHYSICIAN:  Quentin Orealph L. Ely III, MD  PRIMARY CARE PHYSICIAN: Neomia Dearavid N. Harrington Challengerhies, MD   ADMITTING PHYSICIAN: [facility].  CHIEF COMPLAINT: Abdominal pain and distention.   HISTORY OF PRESENT ILLNESS: The patient is a 81 year old gentleman seen in the Intensive Care Unit following admission through the Emergency Room with abdominal pain. He has had increasing abdominal distention with failure to thrive. He had decreased oral intake and difficulty swallowing. He did not complain of any significant abdominal discomfort, although he does have marked expressive aphasia. He presented to the Emergency Room for further evaluation. CT scan revealed what appeared to be a sigmoid volvulus. There were also some changes in his lung fields consistent with possible aspiration. [facility] service admitted the patient to the hospital. He was seen by the GI service and colonoscopic decompression was completed. The patient is much improved and surgical service is consulted for further evaluation of his sigmoid volvulus.   The patient is in the Intensive Care Unit under observation at the present time. He does have severe aphasia and it is very difficult to communicate with him. He does appear comfortable, awake but poorly responsive.   PAST MEDICAL HISTORY: Other than his stroke is significant for hypertension, bradycardia, type 2 diabetes and Parkinson disease.   MEDICATIONS: Well outlined in his admission note.   PAST SURGICAL HISTORY: Prostate surgery is his only surgical procedure other than a right hip fracture repair in June 2013 performed by Dr. Kennedy BuckerMichael Menz.   FAMILY HISTORY: Noncontributory.  SOCIAL HISTORY: He is not a cigarette smoker. He drinks no alcohol at the present time.  REVIEW OF SYSTEMS: Impossible to obtain at the present time because of his expressive aphasia.   PHYSICAL  EXAMINATION:  GENERAL: He appears to be alert but poorly responsive. He does not communicate with me during the interview at all.  VITAL SIGNS: Blood pressure is 128/74. Heart rate is 92 and regular.  HEENT: Unremarkable. He has no pupillary abnormalities. No facial deformities. No scleral icterus.  NECK: Supple, nontender with no adenopathy. Trachea is midline.  CHEST: Clear with no adventitious sounds. Appears to have normal pulmonary excursion.  CARDIAC: No murmurs or gallops to my ear. He seems to be in normal sinus rhythm.  ABDOMEN: Flat, with no significant abdominal tenderness. He has hypoactive but present bowel sounds. No groin hernias are noted. He has no rebound or guarding.  EXTREMITIES: Decreased pulses. Impaired range of motion but no obvious deformities.  PSYCHIATRIC: Impossible because of his aphasia.   ASSESSMENT AND PLAN: I have independently reviewed his CT scan. He clearly had what appeared to be a large sigmoid volvulus. The volvulus was decompressed by the gastroenterology service. The surgical service is consulted to discuss possible definitive resection. With his multiple medical problems, he is a poor candidate for surgical intervention. However, the likelihood of recurrence of his volvulus requires further consideration. We will discuss the options with the family and be available to assist with decision making.   I appreciate the opportunity to be of service.    ____________________________ Carmie Endalph L. Ely III, MD rle:jm D: 04/15/2012 03:35:44 ET T: 04/15/2012 15:13:43 ET JOB#: [id number]  cc: Carmie Endalph L. Ely III, MD, <Dictator> Neomia Dearavid N. Harrington Challengerhies, MD Christena DeemMartin U. Skulskie, MD Quentin OreALPH L ELY MD ELECTRONICALLY SIGNED 04/15/2012 22:14

## 2014-07-05 NOTE — Consult Note (Signed)
Chief Complaint:   Subjective/Chief Complaint seen for sigmoid volvulus.  s/p sigmoid colectomy/ostomy.  denies abd pain or nausea.   VITAL SIGNS/ANCILLARY NOTES: **Vital Signs.:   05-Feb-14 13:56   Vital Signs Type Routine   Temperature Temperature (F) 98.5   Celsius 36.9   Temperature Source Oral   Pulse Pulse 70   Respirations Respirations 18   Systolic BP Systolic BP 968   Diastolic BP (mmHg) Diastolic BP (mmHg) 69   Mean BP 102   Pulse Ox % Pulse Ox % 93   Pulse Ox Activity Level  At rest   Oxygen Delivery Room Air/ 21 %   Brief Assessment:   Cardiac Regular    Respiratory clear BS    Gastrointestinal details normal mild distension, few positive bowel sounds ostomy passing stool   Lab Results: Routine Chem:  05-Feb-14 04:58    Glucose, Serum  169   BUN 14   Creatinine (comp) 0.69   Sodium, Serum 141   Potassium, Serum  3.4   Chloride, Serum  110   CO2, Serum 25   Calcium (Total), Serum  7.9   Anion Gap  6   Osmolality (calc) 286   eGFR (African American) >60   eGFR (Non-African American) >60 (eGFR values <21m/min/1.73 m2 may be an indication of chronic kidney disease (CKD). Calculated eGFR is useful in patients with stable renal function. The eGFR calculation will not be reliable in acutely ill patients when serum creatinine is changing rapidly. It is not useful in  patients on dialysis. The eGFR calculation may not be applicable to patients at the low and high extremes of body sizes, pregnant women, and vegetarians.)  Routine Hem:  05-Feb-14 04:58    WBC (CBC) 9.6 (Result(s) reported on 19 Apr 2012 at 06:19AM.)   Assessment/Plan:  Assessment/Plan:   Assessment 1)  sigmoid volvulus, s/p coloecomy and ostomy.  doing well.  will sign off.  reconsult if needed.    Plan as above   Electronic Signatures: SLoistine Simas(MD)  (Signed 05-Feb-14 15:57)  Authored: Chief Complaint, VITAL SIGNS/ANCILLARY NOTES, Brief Assessment, Lab Results,  Assessment/Plan   Last Updated: 05-Feb-14 15:57 by SLoistine Simas(MD)

## 2014-07-05 NOTE — Consult Note (Signed)
Chief Complaint:   Subjective/Chief Complaint seen for sigmoid volvulus.  no overt discomfort noted though patient seems to indicate some intermittant discomfort.  no spontaneous stool.   VITAL SIGNS/ANCILLARY NOTES: **Vital Signs.:   01-Feb-14 14:00   Vital Signs Type Routine   Temperature Temperature (F) 98.1   Celsius 36.7   Temperature Source oral   Pulse Pulse 56   Respirations Respirations 16   Systolic BP Systolic BP 774   Diastolic BP (mmHg) Diastolic BP (mmHg) 68   Mean BP 90   Pulse Ox % Pulse Ox % 94   Pulse Ox Activity Level  At rest   Oxygen Delivery Room Air/ 21 %   Pulse Ox Heart Rate 56    15:00   Vital Signs Type Routine   Pulse Pulse 54   Respirations Respirations 16   Systolic BP Systolic BP 142   Diastolic BP (mmHg) Diastolic BP (mmHg) 63   Mean BP 94   Pulse Ox % Pulse Ox % 95   Pulse Ox Activity Level  At rest   Oxygen Delivery Room Air/ 21 %   Pulse Ox Heart Rate 54   Brief Assessment:   Cardiac Regular    Respiratory clear BS    Gastrointestinal details normal distended, tympanitic, bowel sounds distant, high pitch, less than yesterday.  mild tenderness to palpation   Lab Results: Hepatic:  01-Feb-14 03:47    Bilirubin, Total 0.6   Alkaline Phosphatase 68   SGPT (ALT)  7   SGOT (AST) 15   Total Protein, Serum 6.6   Albumin, Serum  2.8  Cardiology:  01-Feb-14 12:56    Echo Doppler  Interpretation Summary   Left ventricular systolic function is normal. Ejection Fraction =  >55%. The transmitral spectral Doppler flow pattern is suggestive of  impaired LV relaxation. The right ventricular systolic function is  normal. Right ventricular systolic pressure is elevated at 30-35mHg.   PatientHeight: 178 cm   PatientWeight: 70 kg   SystolicPressure: 1395mmHg   DiastolicPressure: 54 mmHg   HeartRate: 57 bpm   BSA: 1.9 m2  Procedure:   A two-dimensional transthoracic echocardiogram with color flow and  Doppler was performed.  Left  Ventricle   Ejection Fraction = >55%.   The transmitral spectral Doppler flow pattern is suggestive of  impaired LV relaxation.   The left ventricular wall motion is normal.   Theleft ventricle is normal in size.   There is normal left ventricular wall thickness.   Left ventricular systolic function is normal.  Right Ventricle   The right ventricle is normal size.   The right ventricular systolic function is normal.  Atria   The left atrial size is normal.   Right atrial size is normal.   There is no Doppler evidence for an atrial septal defect.  Mitral Valve   The mitral valve is grossly normal.   There is no mitral valve stenosis.   There is trace mitralregurgitation.  Tricuspid Valve   Right ventricular systolic pressure is elevated at 30-423mg.   The tricuspid valve is not well visualized, but is grossly normal.   There is mild tricuspid regurgitation.  Aortic Valve   Mild aortic valve sclerosis.   No aortic regurgitation is present.   No hemodynamically significant valvular aortic stenosis.  Pulmonic Valve   Trace pulmonic valvular regurgitation.   The pulmonic valve is not well seen, but is grossly normal.  Great Vessels   The aortic root is normal size.  The pulmonary is not well visualized.  Pericardium/Pleural   There is no pleural effusion.   No pericardial effusion.  MMode 2D Measurements and Calculations   RVDd: 3.4 cm   IVSd: 1.1 cm   LVIDd: 5.1 cm   LVIDs: 3.2 cm   LVPWd: 1.1 cm   FS: 36 %   EF(Teich): 65 %   Ao root diam: 2.7 cm   LA dimension: 3.5 cm   LVOT diam: 1.9 cm  Doppler Measurements and Calculations   MV E point: 81 cm/sec   MV A point: 98 cm/sec   MV E/A: 0.82    MV P1/2t max vel: 88 cm/sec   MV P1/2t: 74 msec   MVA(P1/2t): 3.0 cm2   MV dec slope: 350 cm/sec2   Ao V2 max: 168 cm/sec   Ao max PG: 11 mmHg   Ao V2 mean: 104 cm/sec   Ao mean PG: 5.0 mmHg   Ao V2 VTI: 38 cm   AVA(V,D): 1.8 cm2   LV max PG: 4.0 mmHg   LV V1  max: 105 cm/sec   PA V2 max: 86 cm/sec   PA max PG: 3.0 mmHg   PI end-d vel: 84 cm/sec   TR Max vel: 280 cm/sec   TR Max PG: 31 mmHg   RVSP: 41 mmHg   RAP systole: 10 mmHg  Reading Physician: Ida Rogue  Sonographer: Thornton-Maynard, Carlean Jews., RDCS Interpreting Physician:  Ida Rogue,  electronically signed on  04-15-2012 15:15:57 Requesting Physician: Ida Rogue  Routine Chem:  01-Feb-14 03:47    Potassium, Serum  3.0   Glucose, Serum 84   BUN  28   Creatinine (comp) 1.11   Sodium, Serum  146   Chloride, Serum  111   CO2, Serum 30   Calcium (Total), Serum  8.3   Osmolality (calc) 295   eGFR (African American) >60   eGFR (Non-African American) >60 (eGFR values <87m/min/1.73 m2 may be an indication of chronic kidney disease (CKD). Calculated eGFR is useful in patients with stable renal function. The eGFR calculation will not be reliable in acutely ill patients when serum creatinine is changing rapidly. It is not useful in  patients on dialysis. The eGFR calculation may not be applicable to patients at the low and high extremes of body sizes, pregnant women, and vegetarians.)   Anion Gap  5    10:24    Potassium, Serum  3.3 (Result(s) reported on 15 Apr 2012 at 10:49AM.)  Routine Hem:  01-Feb-14 03:47    WBC (CBC) 8.8   RBC (CBC)  3.84   Hemoglobin (CBC)  10.4   Hematocrit (CBC)  31.0   Platelet Count (CBC) 173   MCV 81   MCH 27.0   MCHC 33.4   RDW  18.6   Neutrophil % 82.6   Lymphocyte % 9.3   Monocyte % 6.7   Eosinophil % 0.7   Basophil % 0.7   Neutrophil #  7.3   Lymphocyte #  0.8   Monocyte # 0.6   Eosinophil # 0.1   Basophil # 0.1 (Result(s) reported on 15 Apr 2012 at 04:08AM.)   Assessment/Plan:  Assessment/Plan:   Assessment 1) 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  unable to pass scope above the splenic flexure yesterday due to solid stool. 2) multiple medical issues including parkinsons disease,  CVA h/o,  bradycardia.    Plan 1) case discussed with Dr LRexene Edison  I do not think repeat colonoscopy will be of  any long term benefit.   Agree patient is not a good surgical candidate, however, recurrence of volvulus is concerning that surgical intervention may be needed sooner.   Electronic Signatures: Loistine Simas (MD)  (Signed 01-Feb-14 16:02)  Authored: Chief Complaint, VITAL SIGNS/ANCILLARY NOTES, Brief Assessment, Lab Results, Assessment/Plan   Last Updated: 01-Feb-14 16:02 by Loistine Simas (MD)

## 2014-07-05 NOTE — Consult Note (Signed)
Chief Complaint:   Subjective/Chief Complaint seen for sigmoid volvulus.  stable, no emesis, soem small liquid bm.  continues with abdomnal distension.   VITAL SIGNS/ANCILLARY NOTES: **Vital Signs.:   02-Feb-14 09:52   Temperature Temperature (F) 98.2   Celsius 36.7   Temperature Source Oral   Pulse Pulse 51   Respirations Respirations 20   Systolic BP Systolic BP 677   Diastolic BP (mmHg) Diastolic BP (mmHg) 61   Mean BP 96   Pulse Ox % Pulse Ox % 96   Pulse Ox Activity Level  At rest   Oxygen Delivery Room Air/ 21 %   Brief Assessment:   Cardiac Regular    Respiratory clear BS    Gastrointestinal details normal No rebound tenderness  distensed, tympanitic, positive bs, variable in pitch.  positive tympany, mildly tender to palpation   Lab Results: Routine Chem:  02-Feb-14 04:19    Glucose, Serum 76   BUN 18   Creatinine (comp) 0.73   Sodium, Serum  146   Potassium, Serum 3.9   Chloride, Serum  116   CO2, Serum 22   Calcium (Total), Serum  7.9   Anion Gap 8   Osmolality (calc) 291   eGFR (African American) >60   eGFR (Non-African American) >60 (eGFR values <35m/min/1.73 m2 may be an indication of chronic kidney disease (CKD). Calculated eGFR is useful in patients with stable renal function. The eGFR calculation will not be reliable in acutely ill patients when serum creatinine is changing rapidly. It is not useful in  patients on dialysis. The eGFR calculation may not be applicable to patients at the low and high extremes of body sizes, pregnant women, and vegetarians.)  Routine Hem:  02-Feb-14 04:19    WBC (CBC) 9.9   RBC (CBC)  3.99   Hemoglobin (CBC)  10.7   Hematocrit (CBC)  33.0   Platelet Count (CBC) 189   MCV 83   MCH 26.8   MCHC 32.5   RDW  18.8   Neutrophil % 85.0   Lymphocyte % 6.4   Monocyte % 6.8   Eosinophil % 1.1   Basophil % 0.7   Neutrophil #  8.4   Lymphocyte #  0.6   Monocyte # 0.7   Eosinophil # 0.1   Basophil # 0.1 (Result(s)  reported on 16 Apr 2012 at 05:10AM.)   Assessment/Plan:  Assessment/Plan:   Assessment 1)  sigmoid volvulus- , uncertain as to recurrance.  continues with abdominal distension.  S/P colonoscopic decompression 2 days ago.   2) multiple medical illnesses, with h/o cva and parkinsons dz.    Plan 1) on miralax.  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  Patient is not a good candidate for surgery, though at risk for continued/worsened difficulty.  Following.  Discussed with Dr LRexene Edison   Electronic Signatures: SLoistine Simas(MD)  (Signed 02-Feb-14 15:01)  Authored: Chief Complaint, VITAL SIGNS/ANCILLARY NOTES, Brief Assessment, Lab Results, Assessment/Plan   Last Updated: 02-Feb-14 15:01 by SLoistine Simas(MD)

## 2014-07-05 NOTE — Consult Note (Signed)
PATIENT NAME:  Johnathan NestleCHRISTOPHER, Fleming Y MR#:  [id number] DATE OF BIRTH:  10-10-1930  DATE OF CONSULTATION:  04/14/2012  REFERRING PHYSICIAN:  Aurther LoftAdaorah E. Okafor, MD CONSULTING PHYSICIAN:  Christena DeemMartin U. Armani Brar, MD  REASON FOR CONSULTATION:  Sigmoid volvulus.   HISTORY OF PRESENT ILLNESS:  The patient is a 81 year old Caucasian male who lives at home with his wife. He awoke early this morning with abdominal pain and was brought to the Emergency Room for further evaluation. He had a stroke in the past which has left him nonverbal. However, he does indicate answers to questions and follows directions quite well. Apparently, his appetite has been down for several days. When he awoke this morning, family members found him on the floor complaining of pain in the abdomen. There was no emesis. His abdomen was found to be very hard. Family members who could give more detailed history had left by the time I saw him; however, history reported on the admission history and physical indicates that he had a decreased p.o. intake for last 2 to 3 days. Per his wife, he quit eating and would not swallow. Abdominal distention increased over a period of the past several days as well. The patient would rub his stomach. There was no evidence of diarrhea.   PAST MEDICAL HISTORY:  Hypertension, expressive aphasia status post CVA, type 2 diabetes (he is diet controlled), hypokalemia, history of Parkinson's disease, BPH, depression, history of bradycardia.   PAST SURGICAL HISTORY:  He has had prostate surgery and a right hip surgery that being in June 2013. That procedure was related to a right femoral neck fracture impacted and he underwent right hip multiple pinning.   OUTPATIENT MEDICATIONS:  Carbidopa/levodopa 25/100 mg 2 tablets twice a day, carvedilol 6.25 mg 1 twice a day, citalopram 10 mg once a day, losartan 50 mg a day, Seroquel 25 mg once a day, he takes Namenda 10 mg twice a day, Nasacort inhaled sprays p.r.n., Tussionex 5  mg twice a day, Levaquin 750 mg daily (he apparently is currently under recent treatment for upper respiratory tract infection), amlodipine 5 mg daily, ibuprofen 600 mg 3 times a day. He is not on a proton pump inhibitor.   REVIEW OF SYSTEMS:  Unable to obtain review of systems. There apparently however is a history of chronic aspiration.   PHYSICAL EXAMINATION: VITAL SIGNS: Temperature 98.5, pulse 52, respirations 16, blood pressure 191/60. This has gone to a pulse being 48, respirations 15, blood pressure 141/49 with a pulse oximetry 96% prior to him being brought to the endoscopy unit.  HEENT: Normocephalic, atraumatic. Eyes: Anicteric. Nose: Septum midline. Oropharynx: No lesions.  NECK: No JVD.  HEART: Regular rate and rhythm.  LUNGS: Clear.  ABDOMEN: Distended, tympanitic, bowel sounds are positive, occasional high pitch, but rare. Overall, bowel sounds were more so toward normal. He was nontender on examination earlier this morning in the Emergency Room at about 7:30; however, on recheck at about 11:00, he was more tender particularly on the right side of the abdomen.  EXTREMITIES: No clubbing, cyanosis, or edema.  NEUROLOGICAL: Cranial nerves II through XII grossly intact. Muscle strength bilaterally equal and symmetric.   DIAGNOSTIC DATA:  On admission to the hospital, he had glucose of 130, BUN 30, creatinine 1.46, sodium 142, potassium 3.0, chloride 102, bicarbonate 30, osmolality 291, calcium 9.3 and lipase 45. His hepatic profile was normal. Cardiac enzymes x 2 shows slight elevations of troponin I at 0.20 and 0.19 respectively. He had a hemogram showing  a white count of 14.1, hemoglobin and hematocrit 10.3/33.0, platelet count 205, MCV 81. Urinalysis showed 2+ blood, 30 mg/dL protein, 7 white cells per high-power field. His pro-time was 14.2, INR 1.1, activated PTT was 35.9. He has had 2 potassiums drawn today, one at about 11:50 showing 2.8. He has been given 2 jumps of potassium today.  His last potassium at 1508 this afternoon was 3.0. He has a CT scan of the abdomen and pelvis showing a sigmoid volvulus, chronic lung disease with bronchiectasis, possible aspiration pneumonia, fluid in the distal esophagus possibly related to gastroesophageal reflux and there is cardiomegaly. There is no pneumothorax. There is no evidence of pneumatosis or evidence of perforation. He had a portable chest film showing marked distention of bowel below the hemidiaphragms compromising expansion of the lungs and a portable chest film later on showing a nasogastric tube in the gastric cardia.   ASSESSMENT:  The patient is a 81 year old male who is presenting today with a sigmoid volvulus. Serial exams over the course of the day have shown no improvements. Recommend colonoscopic decompression with anesthesia assistance. I have discussed the risks, benefits and complications of these procedures with family members to include wife, son, daughter and son-in-law. The risks include, but are not limited to bleeding, infection, perforation, the risk of sedation and they wish to proceed. I have explained to them that this is considered as a high-risk procedure. They also understand that he is at risk for spontaneous perforation as well. Further recommendations to follow.   I agree with antibiotics as you are. Blood cultures are pending. I would place him on a proton pump inhibitor as he has been taking multiple doses of NSAIDs daily.    ____________________________ Christena Deem, MD mus:si D: 04/14/2012 16:20:00 ET T: 04/14/2012 16:43:57 ET JOB#: [id number]  cc: Christena Deem, MD, <Dictator> Christena Deem MD ELECTRONICALLY SIGNED 04/21/2012 6:15

## 2014-07-05 NOTE — Consult Note (Signed)
Chief Complaint:   Subjective/Chief Complaint patient seen for abdominal distension and sigmoid volvulus.  continue to be distended, minimal discomfort currently.  no n/v, small liquid stool   VITAL SIGNS/ANCILLARY NOTES: **Vital Signs.:   03-Feb-14 14:00   Vital Signs Type Q 4hr   Temperature Temperature (F) 97.3   Celsius 36.2   Temperature Source oral   Pulse Pulse 51   Respirations Respirations 18   Systolic BP Systolic BP 090   Diastolic BP (mmHg) Diastolic BP (mmHg) 71   Mean BP 109   Pulse Ox % Pulse Ox % 96   Pulse Ox Activity Level  At rest   Oxygen Delivery Room Air/ 21 %  *Intake and Output.:   03-Feb-14 10:20   Stool  small soft stool   Brief Assessment:   Cardiac Regular    Respiratory clear BS    Gastrointestinal details normal distended, tympanitic, minimal discomfort/pain.  rare high pitched bs.   Lab Results: Routine Chem:  03-Feb-14 04:35    Glucose, Serum  61   BUN 15   Creatinine (comp) 0.72   Sodium, Serum 144   Potassium, Serum  3.3   Chloride, Serum  109   CO2, Serum 23   Calcium (Total), Serum  8.1   Anion Gap 12   Osmolality (calc) 286   eGFR (African American) >60   eGFR (Non-African American) >60 (eGFR values <72m/min/1.73 m2 may be an indication of chronic kidney disease (CKD). Calculated eGFR is useful in patients with stable renal function. The eGFR calculation will not be reliable in acutely ill patients when serum creatinine is changing rapidly. It is not useful in  patients on dialysis. The eGFR calculation may not be applicable to patients at the low and high extremes of body sizes, pregnant women, and vegetarians.)   Assessment/Plan:  Assessment/Plan:   Assessment 1) abdominal distension, possible recurrent sigmoid volvulus. case discussed with Dr BMarina Gravel  agree with plans for surgery.   Electronic Signatures: SLoistine Simas(MD)  (Signed 03-Feb-14 18:44)  Authored: Chief Complaint, VITAL SIGNS/ANCILLARY NOTES, Brief  Assessment, Lab Results, Assessment/Plan   Last Updated: 03-Feb-14 18:44 by SLoistine Simas(MD)

## 2014-07-05 NOTE — Discharge Summary (Signed)
PATIENT NAME:  Johnathan Nolan, Johnathan Nolan MR#:  [id number] DATE OF BIRTH:  10/09/1930  DATE OF ADMISSION:  04/14/2012 DATE OF DISCHARGE:    CONTINUATION OF DISCHARGE SUMMARY:    On postoperative day 6, the patient's ostomy was functioning nicely. His wound was healing nicely. He is tolerating a regular diet and his abdomen was soft. Arrangements were made with case management for the patient to be transferred to local nursing home.   DISCHARGE MEDICATIONS: 1.  Tramadol 50 mg by mouth every 6 hours as needed for pain.  2.  Losartan 50 mg by mouth once a day for blood pressure.  3. Carbidopa/levodopa 25 mg-100 mg by mouth tablets 2 tablets by mouth daily 45 minutes before meals.  4.  Coreg 6.25 mg per mouth b.i.d. 5.  Seroquel 25 mg by mouth at bedtime.  6.  Citalopram 10 mg by mouth once a day.  7.  Amlodipine 10 mg by mouth once a day. 8. Tylenol 325 mg by mouth, 2 tabs every 4 hours as needed for pain, temperature greater than 100.4.   DISCHARGE INSTRUCTIONS: The patient may shower or bathe.   DIET: Regular with pureed meals. Follow up in my office February 14, [place] location for staple removal and follow-up.     ____________________________ Redge GainerMark A. Egbert GaribaldiBird, MD mab:cc D: 04/23/2012 18:24:44 ET T: 04/23/2012 19:07:25 ET JOB#: [id number] cc: Loraine LericheMark A. Egbert GaribaldiBird, MD, <Dictator> Jeferson Boozer Kela MillinA Dericka Ostenson MD ELECTRONICALLY SIGNED 04/24/2012 7:50

## 2014-07-07 NOTE — H&P (Signed)
PATIENT NAME:  Johnathan NestleCHRISTOPHER, Johnathan Nolan MR#:  [id number] DATE OF BIRTH:  October 15, 1930  DATE OF ADMISSION:  09/02/2011  CHIEF COMPLAINT: Right hip pain.   HISTORY OF PRESENT ILLNESS: The patient is a 81 year old male who suffered a fall the day before admission. He had been having pain, and apparently it was a mechanical fall. He is supposed to use a walker and was not, fell on the right side. He was brought to the Emergency Room today, and it shows an impacted right femoral neck fracture.   PAST MEDICAL HISTORY:  1. He has significant past medical history of Parkinson's disease with significant problems related to this, including difficulty with speech and movement.  2. Hypertension.  3. Hypokalemia.  4. Depression. 5. Benign prostatic hypertrophy. 6. Diabetes. 7. CVA and stroke.   ALLERGIES: He has allergy to Zithromax and gets hives.   MEDICATIONS ON ADMISSION:  1. Losartan 50 mg, 1 p.o. daily.  2. Carbidopa/levodopa 25/100, 2 tablets q.i.d.   REVIEW OF SYSTEMS: Review of systems is essentially negative.   SOCIAL HISTORY: He is nondrinker, nonsmoker and lives with him family.   PHYSICAL EXAMINATION:  GENERAL: A slender white male who appears his stated age, in no acute distress.   HEENT: Unremarkable.   LUNGS: Clear.  HEART: Regular rate and rhythm.   ABDOMEN: Soft, nontender, with positive bowel sounds.   EXTREMITIES: He has no shortening or rotatory deformity to the right leg. He does have pain with logrolling. He is neurovascularly intact distally. He is able to flex and extend the toes and strength is symmetric.   RADIOLOGICAL DATA: X-rays reveal an impacted femoral neck fracture without displacement. It almost appears incomplete with possibly the posterior neck being intact.   CLINICAL IMPRESSION: Right femoral neck fracture in a patient with Parkinson's disease.   RECOMMENDATION: Recommendation is for multiple screw fixation, understanding that with Parkinson's patients  who have tremor sometimes this fails, but I think with a very stable-appearing fracture pattern it is likely to heal uneventfully. The risks of infection, avascular necrosis and nonunion were discussed.   ____________________________ Leitha SchullerMichael J. Kyesha Balla, MD mjm:cbb D: 09/03/2011 06:44:33 ET T: 09/03/2011 09:33:47 ET JOB#: [id number]  cc: Leitha SchullerMichael J. Teyana Pierron, MD, <Dictator> Leitha SchullerMICHAEL J Naylea Wigington MD ELECTRONICALLY SIGNED 09/03/2011 9:56

## 2014-07-07 NOTE — Discharge Summary (Signed)
PATIENT NAME:  Johnathan NestleCHRISTOPHER, Marcquis Y MR#:  [id number] DATE OF BIRTH:  [birth year]  DATE OF ADMISSION:  09/02/2011 DATE OF DISCHARGE:  09/06/2011  ADMITTING DIAGNOSIS: Right femoral neck fracture.   DISCHARGE DIAGNOSES:  1. Right femoral neck fracture.  2. Hypokalemia, replete.  3. Anemia.  4. Dysphagia.   ATTENDING PHYSICIAN: Dr. Kennedy BuckerMichael Menz [facility]   PRIMARY CARE PHYSICIAN: Dr. Mickey Farberavid Thies    CONSULTING PHYSICIANS:  1. Dr. Adrian SaranSital Mody. 2. Dr. Enedina FinnerSona Patel. 3. Dr. Hilda LiasVivek Sainani.    PROCEDURES: On June 21st, the patient underwent nailing/pinning of right femoral neck fracture by Dr. Rosita KeaMenz. Anesthesia was general. Estimated blood loss was minimal. Operative findings included a well-aligned fracture. Implants: Implants were by [employer] and included 4 cannulated screws. No drains were placed. No complications had occurred.   PATIENT HISTORY: Mr. Johnathan DeerChristopher is a 81 year old who suffered a fall on 09/01/2011. He had been having pain, and apparently it was a mechanical fall. He was supposed to use a walker and was not and fell on his right side. He was brought to the ER and x-rays revealed an impacted right femoral neck fracture.   PAST MEDICAL HISTORY:  1. Parkinson's disease with significant problems related to this, including difficulty with speech and movement.  2. Hypertension.  3. Hypokalemia.  4. Depression.  5. Benign prostatic hypertrophy.  6. Diabetes 7. Cerebrovascular accident.   ALLERGIES AND ADVERSE REACTIONS: Zithromax causes hives.   PHYSICAL EXAMINATION: GENERAL: A slender white male who appears his stated age, in no acute distress. LUNGS: Clear. HEART: Regular rate and rhythm. EXTREMITIES: He has no shortening or rotatory deformity to the right leg. He does have pain with logrolling. He is neurovascularly intact distally. He is to flex and extend the toes and strength is symmetric.   HOSPITAL COURSE: The patient was admitted on 09/03/2011 because  of the aforementioned fracture. The fracture had appeared almost incomplete to Dr. Rosita KeaMenz. Medicine was consulted to follow the patient, continue losartan and Parkinson's medications. A urinalysis was checked. The patient's urinalysis was negative for any sign of infection, really. The patient's magnesium level was 1.7, so just a bit low. His potassium was also pretty low, and he would undergo supplementation with both potassium and magnesium. On June 21st, he had undergone  a surgical procedure to his right hip by Dr. Rosita KeaMenz without any complication and was transferred to the PACU and then the Orthopedic floor in stable condition. His hemoglobin would drop down to 10.2, but no transfusion was deemed necessary. He had been on 2 liters nasal cannula oxygen while here. He was treated with Lovenox and TED hose for deep vein thrombosis prophylaxis. He had been tolerating his diet but was having trouble swallowing, and so his diet had been altered while here. He had had undergone Speech and Language Pathology assessment. On postoperative day two, his right hip dressing was changed, and his incision site was found to be intact with staples and no sign of infection. On postoperative day three, the patient was answering questions. His hypokalemia had resolved. He had been working with physical therapy while here but really had not made much progress, unfortunately. He did do well concerning pain. He did ambulate about 20 feet on the 24th with a rolling walker. He had tolerated his diet. He had  passed some stool while here.   CONDITION AT DISCHARGE: Stable.   DISPOSITION: [facility].   DISCHARGE MEDICATIONS:  1. Lovenox 40 mg subcutaneous injection daily, stop in  21 days from 06/24. 2. Clonidine tablet 0.2 mg per oral every 12 hours as needed for hypertension, for systolic blood pressure greater than 150. 3. Insulin sliding scale Novolin R injection.  4. Losartan 50 mg oral daily. 5. Sinemet 25/100, 2 tablets per  oral at bedtime and 2 tablets per oral t.i.d. with meals. 6. Percocet 5/325 mg, 1 to 2 every 4 to 6 hours as needed for pain.   DISCHARGE INSTRUCTIONS AND FOLLOWUP:   1. Apply an exit alarm.  2. Physical Therapy and Occupational Therapy consult.  3. He is weight-bearing as tolerated on the right leg.  4. A six-week follow-up appointment should be made with Dr. Rosita Kea at [facility] for right hip x-rays.  5. On July 5th, the patient may have right hip staples removed and Steri-Strips placed. Please call our office for any sign of infection.  6. TED hose knee-high are to be worn daily for six weeks status post surgery. These may be removed at night or for bathing.  7. His diet is dysphagia III, chopped, honey thick, no straws. He is on precaution for aspiration.  8. It is recommended his medication for Parkinson's disease be given about 45 minutes prior to meals to aid swallowing.   ____________________________ Letta Moynahan. Teja Judice, [place] jrp:cbb D: 09/06/2011 16:27:40 ET T: 09/06/2011 17:08:25 ET JOB#: [id number]  cc: Letta Moynahan. Clyde Canterbury, [place], <Dictator> Letta Moynahan Rodnisha Blomgren PA ELECTRONICALLY SIGNED 09/08/2011 11:59

## 2014-07-07 NOTE — Consult Note (Signed)
PATIENT NAME:  Johnathan Nolan, Johnathan Nolan MR#:  [id number] DATE OF BIRTH:  [birth year]  DATE OF CONSULTATION:  09/02/2011  REFERRING PHYSICIAN:  Kennedy Bucker, MD   CONSULTING PHYSICIAN:  Couper Juncaj P. Juliene Pina, MD PRIMARY CARE PHYSICIAN: Neomia Dear. Thies, MD  REASON FOR CONSULTATION: Clearance for right hip fracture.   IMPRESSION:  1. Preoperative clearance for right hip fracture after a mechanical fall.  2. History of Parkinson's disease.  3. History of hypertension.  4. Leukocytosis.  5. Hypokalemia.  6. History of diabetes, not on any medication.  7. History of a stroke with expressive aphasia.   PLAN:  1. The patient is moderate risk for a moderate risk procedure. May proceed without further cardiac work-up.  2. Continue outpatient medications, including losartan and Sinemet.  3. ADA diet when able to take in p.o. and sliding scale insulin for now.   HISTORY OF PRESENT ILLNESS: The patient is a 81 year old very soft spoken male with a history of type 2 diabetes-diet controlled, hypertension, expressive aphasia status post a stroke, who was brought in via ambulance after a fall. The patient apparently fell two days ago. He says it was a mechanical fall. Since then he has had difficulty walking. Son called EMS, and he was brought here for further evaluation.   REVIEW OF SYSTEMS: CONSTITUTIONAL: The patient denies any fever, fatigue. He has got generalized weakness. EYES: No blurry vision. ENT: He has got some mild hearing loss, no snoring or epistaxis. RESPIRATORY: No cough, wheezing, hemoptysis or chronic obstructive pulmonary disease. CARDIOVASCULAR: No chest pain, syncope, palpitations, orthopnea, edema, arrhythmia, dyspnea on exertion. GASTROINTESTINAL: No nausea, vomiting, diarrhea, abdominal pain, melena or ulcers. GENITOURINARY: No dysuria or hematuria. ENDOCRINE: No polyuria or polydipsia. HEMATOLOGIC/LYMPHATIC: Positive anemia and easy bruising. SKIN: No rash or lesion.  MUSCULOSKELETAL: He denies  any pain currently. He is supposed to use a walker. NEUROLOGICAL: No history of transient ischemic attacks. He has had a cerebrovascular accident with expressive aphasia.  PSYCHIATRIC:  No anxiety or depression.  PAST MEDICAL HISTORY:  1. Benign prostatic hypertrophy, status post TURP.  2. Type 2 diabetes, diet-controlled.  3. Hypertension.  4. History of stroke with expressive aphasia.   MEDICATIONS:  1. Carbidopa/levodopa 25/100, 2 tablets q.i.d. at 7, 11, 4 and 7. 2. Losartan 50 mg daily for blood pressure.   ALLERGIES: Zithromax causes hives.   PAST SURGICAL HISTORY: Prostate surgery.   SOCIAL HISTORY: No tobacco, alcohol, or drug use.   FAMILY HISTORY: His family died of old age.   PHYSICAL EXAMINATION:  VITAL SIGNS: Temperature 99.1, pulse 63, respirations 18, blood pressure 161/66, 93% on room air.   GENERAL: The patient is alert, oriented x3, not in any acute distress. He is not complaining of any pain.   HEENT: Head is atraumatic. Pupils are round and reactive. Sclerae are anicteric. Mucous membranes are moist. Oropharynx is clear.    NECK: Supple without jugular venous distention, carotid bruit, or enlarged thyroid.   CARDIOVASCULAR: Regular rate and rhythm. No murmurs, gallops, or rubs. No edema noted.   LUNGS: Clear to auscultation without crackles, rales, rhonchi, or wheezing. Normal percussion. No use of accessory muscles.  ABDOMEN: Bowel sounds are positive. Nontender, nondistended. No splenomegaly.   EXTREMITIES: No clubbing, cyanosis, or edema.   NEUROLOGICAL:  Cranial nerves II through XII are grossly intact. No focal deficits.   MUSCULOSKELETAL: Gait was not assessed due to the patient's condition.   LABORATORY, DIAGNOSTIC AND RADIOLOGIC DATA:  White blood cells 12.3, hemoglobin 12, hematocrit 35,  platelets 159. Sodium 136, potassium 3.4, chloride 96, bicarbonate 30, BUN 28, creatinine 1.29, glucose 118, calcium 9.1, bilirubin 1.3, alkaline phosphatase 73,  AST is 32, ALT 14, total protein 7.4, albumin 3.6.  X-ray shows fracture of the right hip.  EKG: Normal sinus rhythm. There are some T wave inversions in III.   Plan of care will be discussed with Dr. Rosita KeaMenz. We will continue to follow.  TIME SPENT ON CONSULTATION:  55 minutes.  ____________________________ Janyth ContesSital P. Juliene PinaMody, MD spm:cbb D: 09/02/2011 20:10:30 ET T: 09/03/2011 10:02:44 ET JOB#: [id number]  cc: Halen Antenucci P. Juliene PinaMody, MD, <Dictator> Neomia Dearavid N. Harrington Challengerhies, MD Leitha SchullerMichael J. Menz, MD Janyth ContesSITAL P Alexei Ey MD ELECTRONICALLY SIGNED 09/03/2011 14:25

## 2014-07-07 NOTE — Consult Note (Signed)
Zithromax: Hives    Impression preop clearance for right hip fx mechanical fall parkinsons disease htn leukocytosis hypokalemia    Plan mod risk for mod risk procedure may proceed without further cardiac workup replace k and check mg cont losartann and parkinsons meds check UA  will follow thnak you   Electronic Signatures: Adrian SaranMody, Ranisha Allaire (MD)  (Signed 20-Jun-13 20:05)  Authored: Allergies, Impression/Plan   Last Updated: 20-Jun-13 20:05 by Adrian SaranMody, Lycan Davee (MD)

## 2014-07-07 NOTE — Op Note (Signed)
PATIENT NAME:  Johnathan NestleCHRISTOPHER, [name] MR#:  [id number] DATE OF BIRTH:  01-30-1931  DATE OF PROCEDURE:  09/03/2011  PREOPERATIVE DIAGNOSIS: Right femoral neck fracture, impacted.   POSTOPERATIVE DIAGNOSIS: Right femoral neck fracture, impacted.   PROCEDURE: Right hip multiple pinning.   SURGEON: Leitha SchullerMichael J. Marquarius Lofton, MD  ANESTHESIA: General.    DESCRIPTION OF PROCEDURE: Patient was brought to the Operating Room and after adequate anesthesia was obtained, the left leg was placed in the well legholder and the right leg in traction boot without traction required. C-arm was brought in and good visualization of the fracture was identified and there was just slight impaction. No significant displacement. After prepping and draping using the barrier drape method, appropriate patient identification and timeout procedures were completed, a one-inch incision was made laterally and four guidewires were subsequently inserted up across the fracture site. They were measured, drilled, tapped, and then the appropriate length short thread 7.3 cannulated screw was inserted; two 90s, one 100 and one 105 screw were placed. After these screws had been placed and AP and lateral projections taken that showed no pin penetration into the joint, the wound was irrigated and closed with 2-0 Vicryl subcutaneously and skin staples. Xeroform and 4 x 4's along with tape were applied and the patient sent to recovery in stable condition.   ESTIMATED BLOOD LOSS: 25 mL.   COMPLICATIONS: None.   SPECIMEN: None.   IMPLANTS: 7.3 AO cannulated screws. ____________________________ Leitha SchullerMichael J. Maggie Dworkin, MD mjm:cms D: 09/03/2011 23:01:12 ET T: 09/04/2011 10:45:26 ET  JOB#: [id number] Nolon BussingMICHAEL J Suyash Amory MD ELECTRONICALLY SIGNED 09/05/2011 7:54
